# Patient Record
Sex: Female | Born: 1980 | Race: Black or African American | Hispanic: No | Marital: Single | Smoking: Former smoker
Health system: Southern US, Community
[De-identification: ages and names within clinical notes are randomized; demographics above are authoritative.]

## PROBLEM LIST (undated history)

## (undated) ENCOUNTER — Inpatient Hospital Stay (HOSPITAL_COMMUNITY): Payer: Self-pay

## (undated) DIAGNOSIS — F419 Anxiety disorder, unspecified: Secondary | ICD-10-CM

## (undated) DIAGNOSIS — F509 Eating disorder, unspecified: Secondary | ICD-10-CM

## (undated) HISTORY — PX: NO PAST SURGERIES: SHX2092

---

## 2006-10-21 ENCOUNTER — Inpatient Hospital Stay (HOSPITAL_COMMUNITY): Admission: AD | Admit: 2006-10-21 | Discharge: 2006-10-21 | Payer: Self-pay | Admitting: Obstetrics and Gynecology

## 2006-10-29 ENCOUNTER — Inpatient Hospital Stay (HOSPITAL_COMMUNITY): Admission: AD | Admit: 2006-10-29 | Discharge: 2006-10-29 | Payer: Self-pay | Admitting: Obstetrics & Gynecology

## 2010-11-28 LAB — CBC
Hemoglobin: 14.4
Platelets: 180
RDW: 12.9

## 2010-11-28 LAB — URINALYSIS, ROUTINE W REFLEX MICROSCOPIC
Ketones, ur: NEGATIVE
Nitrite: NEGATIVE
pH: 6

## 2010-11-28 LAB — DIFFERENTIAL
Basophils Absolute: 0
Lymphocytes Relative: 55 — ABNORMAL HIGH
Monocytes Absolute: 0.3
Neutro Abs: 2.3
Neutrophils Relative %: 37 — ABNORMAL LOW

## 2010-11-28 LAB — WET PREP, GENITAL
Trich, Wet Prep: NONE SEEN
Yeast Wet Prep HPF POC: NONE SEEN

## 2016-06-11 ENCOUNTER — Encounter (HOSPITAL_COMMUNITY): Payer: Self-pay

## 2016-06-11 ENCOUNTER — Inpatient Hospital Stay (HOSPITAL_COMMUNITY): Payer: Medicaid Other

## 2016-06-11 ENCOUNTER — Inpatient Hospital Stay (HOSPITAL_COMMUNITY)
Admission: AD | Admit: 2016-06-11 | Discharge: 2016-06-11 | Disposition: A | Discharge status: Home / Self Care | Payer: Medicaid Other | Source: Ambulatory Visit | Attending: Obstetrics & Gynecology | Admitting: Obstetrics & Gynecology

## 2016-06-11 DIAGNOSIS — K5900 Constipation, unspecified: Secondary | ICD-10-CM | POA: Diagnosis not present

## 2016-06-11 DIAGNOSIS — O9989 Other specified diseases and conditions complicating pregnancy, childbirth and the puerperium: Secondary | ICD-10-CM | POA: Diagnosis not present

## 2016-06-11 DIAGNOSIS — R109 Unspecified abdominal pain: Secondary | ICD-10-CM | POA: Diagnosis not present

## 2016-06-11 DIAGNOSIS — O219 Vomiting of pregnancy, unspecified: Secondary | ICD-10-CM | POA: Diagnosis not present

## 2016-06-11 DIAGNOSIS — Z3491 Encounter for supervision of normal pregnancy, unspecified, first trimester: Secondary | ICD-10-CM

## 2016-06-11 DIAGNOSIS — Z3A12 12 weeks gestation of pregnancy: Secondary | ICD-10-CM | POA: Diagnosis not present

## 2016-06-11 DIAGNOSIS — O26891 Other specified pregnancy related conditions, first trimester: Secondary | ICD-10-CM | POA: Insufficient documentation

## 2016-06-11 DIAGNOSIS — O99611 Diseases of the digestive system complicating pregnancy, first trimester: Secondary | ICD-10-CM

## 2016-06-11 DIAGNOSIS — O26899 Other specified pregnancy related conditions, unspecified trimester: Secondary | ICD-10-CM

## 2016-06-11 HISTORY — DX: Eating disorder, unspecified: F50.9

## 2016-06-11 HISTORY — DX: Anxiety disorder, unspecified: F41.9

## 2016-06-11 LAB — COMPREHENSIVE METABOLIC PANEL
ALK PHOS: 34 U/L — AB (ref 38–126)
ALT: 14 U/L (ref 14–54)
ANION GAP: 8 (ref 5–15)
AST: 14 U/L — ABNORMAL LOW (ref 15–41)
Albumin: 4 g/dL (ref 3.5–5.0)
BILIRUBIN TOTAL: 0.5 mg/dL (ref 0.3–1.2)
BUN: 6 mg/dL (ref 6–20)
CALCIUM: 9.2 mg/dL (ref 8.9–10.3)
CO2: 23 mmol/L (ref 22–32)
Chloride: 104 mmol/L (ref 101–111)
Creatinine, Ser: 0.6 mg/dL (ref 0.44–1.00)
GFR calc Af Amer: >60 mL/min (ref >60)
Glucose, Bld: 80 mg/dL (ref 65–99)
POTASSIUM: 3.8 mmol/L (ref 3.5–5.1)
Sodium: 135 mmol/L (ref 135–145)
TOTAL PROTEIN: 7 g/dL (ref 6.5–8.1)

## 2016-06-11 LAB — CBC
HEMATOCRIT: 38.4 % (ref 36.0–46.0)
HEMOGLOBIN: 13.3 g/dL (ref 12.0–15.0)
MCH: 30.2 pg (ref 26.0–34.0)
MCHC: 34.6 g/dL (ref 30.0–36.0)
MCV: 87.3 fL (ref 78.0–100.0)
Platelets: 230 10*3/uL (ref 150–400)
RBC: 4.4 MIL/uL (ref 3.87–5.11)
RDW: 13.4 % (ref 11.5–15.5)
WBC: 6.7 10*3/uL (ref 4.0–10.5)

## 2016-06-11 LAB — WET PREP, GENITAL
CLUE CELLS WET PREP: NONE SEEN
Trich, Wet Prep: NONE SEEN
YEAST WET PREP: NONE SEEN

## 2016-06-11 LAB — URINALYSIS, ROUTINE W REFLEX MICROSCOPIC
Bilirubin Urine: NEGATIVE
Glucose, UA: NEGATIVE mg/dL
HGB URINE DIPSTICK: NEGATIVE
Ketones, ur: 5 mg/dL — AB
Leukocytes, UA: NEGATIVE
Nitrite: NEGATIVE
PH: 6 (ref 5.0–8.0)
Protein, ur: NEGATIVE mg/dL
SPECIFIC GRAVITY, URINE: 1.02 (ref 1.005–1.030)

## 2016-06-11 MED ORDER — DOCUSATE SODIUM 100 MG PO CAPS: 100.0000 mg | ORAL_CAPSULE | Freq: Two times a day (BID) | ORAL | 0 refills | Status: DC

## 2016-06-11 MED ORDER — PROMETHAZINE HCL 25 MG PO TABS: 25.0000 mg | ORAL_TABLET | Freq: Four times a day (QID) | ORAL | 0 refills | Status: DC | PRN

## 2016-06-11 MED ORDER — ONDANSETRON 8 MG PO TBDP
8.0000 mg | ORAL_TABLET | Freq: Once | ORAL | Status: AC
  Administered 2016-06-11: 8 mg via ORAL
  Filled 2016-06-11: qty 1

## 2016-06-11 NOTE — MAU Note (Addendum)
Pt c/o nausea/vomiting, abdominal cramping x1 week. States she is unable to keep anything down. Denies vaginal bleeding, Has some vaginal discharge. Had a miscarriage last year and is really worries. Has had 1 PNC visit in [place]-no ultrasound. States EDD 12/21/2016.

## 2016-06-11 NOTE — MAU Note (Signed)
Pt. 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 Feeling dehydrated.  Denies vaginal bleeding, but has off white color discharge, moderate amount.

## 2016-06-11 NOTE — MAU Provider Note (Signed)
History     CSN: [id number]  Arrival date and time: 06/11/16 1845  First Provider Initiated Contact with Patient 06/11/16 2038      Chief Complaint  Patient presents with  . Emesis  . Abdominal Cramping   HPI  Kristina Kennedy is a 36 y.o. [phone] at [redacted]w[redacted]d who presents with abdominal pain & nausea. Abdominal pain started 1 week ago. Reports lower abdominal cramping that is intermittent. Rates pain 8/10. Has not treated. Endorses nausea & vomiting throughout pregnancy. Has not vomited today but vomited 8 times yesterday. Does not have antiemetic at home. Last BM was 4 days ago. Denies vaginal bleeding, fever, diarrhea. Some thin, gray, vaginal discharge. 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   OB History    Gravida Para Term Preterm AB Living   SAB TAB Ectopic Multiple Live Births   2   2          Obstetric Comments   Ectopic x 2, treated with MTX      Past Medical History:  Diagnosis Date  . Anxiety   . Eating disorder     Past Surgical History:  Procedure Laterality Date  . NO PAST SURGERIES      History reviewed. No pertinent family history.  Social History  Substance Use Topics  . Smoking status: Current Some Day Smoker  . Smokeless tobacco: Current User  . Alcohol use 0.6 oz/week    1 Glasses of wine per week    Allergies:  Allergies  Allergen Reactions  . Latex Hives    Prescriptions Prior to Admission  Medication Sig Dispense Refill Last Dose  . albuterol (PROVENTIL HFA;VENTOLIN HFA) 108 (90 Base) MCG/ACT inhaler Inhale 2 puffs into the lungs every 6 (six) hours as needed for wheezing or shortness of breath.   05/28/2016  . Prenatal Vit-Fe Fumarate-FA (PRENATAL MULTIVITAMIN) TABS tablet Take 1 tablet by mouth at bedtime.   06/09/2016    Review of Systems  Constitutional: Negative.   Gastrointestinal: Positive for abdominal pain, constipation, nausea and  vomiting. Negative for diarrhea.  Genitourinary: Positive for vaginal discharge. Negative for dysuria and vaginal bleeding.  Neurological: Positive for dizziness. Negative for headaches.   Physical Exam   Blood pressure 120/72, pulse 89, temperature 97.7 F (36.5 C), resp. rate 17, height  (1.626 m), weight 121 lb (54.9 kg), last menstrual period 03/16/2016, SpO2 100 %.  Physical Exam  Nursing note and vitals reviewed. Constitutional: She is oriented to person, place, and time. She appears well-developed and well-nourished. No distress.  HENT:  Head: Normocephalic and atraumatic.  Eyes: Conjunctivae are normal. Right eye exhibits no discharge. Left eye exhibits no discharge. No scleral icterus.  Neck: Normal range of motion.  Cardiovascular: Normal rate, regular rhythm and normal heart sounds.   No murmur heard. Respiratory: Effort normal and breath sounds normal. No respiratory distress. She has no wheezes.  GI: Soft. Bowel sounds are normal. There is tenderness in the left lower quadrant. There is no rebound and no guarding.  Genitourinary: Uterus is enlarged. Cervix exhibits no motion tenderness and no friability. Right adnexum displays no mass and no tenderness. Left adnexum displays mass and tenderness. No bleeding in the vagina. Vaginal discharge found.  Genitourinary Comments: Cervix closed  Neurological: She is alert and oriented to person, place, and time.  Skin: Skin is warm and dry.  She is not diaphoretic.  Psychiatric: She has a normal mood and affect. Her behavior is normal. Judgment and thought content normal.    MAU Course  Procedures Results for orders placed or performed during the hospital encounter of 06/11/16 (from the past 24 hour(s))  Urinalysis, Routine w reflex microscopic     Status: Abnormal   Collection Time: 06/11/16  7:27 PM  Result Value Ref Range   Color, Urine YELLOW YELLOW   APPearance CLEAR CLEAR   Specific Gravity, Urine 1.020 1.005 - 1.030    pH 6.0 5.0 - 8.0   Glucose, UA NEGATIVE NEGATIVE mg/dL   Hgb urine dipstick NEGATIVE NEGATIVE   Bilirubin Urine NEGATIVE NEGATIVE   Ketones, ur 5 (A) NEGATIVE mg/dL   Protein, ur NEGATIVE NEGATIVE mg/dL   Nitrite NEGATIVE NEGATIVE   Leukocytes, UA NEGATIVE NEGATIVE  Wet prep, genital     Status: Abnormal   Collection Time: 06/11/16  9:04 PM  Result Value Ref Range   Yeast Wet Prep HPF POC NONE SEEN NONE SEEN   Trich, Wet Prep NONE SEEN NONE SEEN   Clue Cells Wet Prep HPF POC NONE SEEN NONE SEEN   WBC, Wet Prep HPF POC FEW (A) NONE SEEN   Sperm PRESENT   CBC     Status: None   Collection Time: 06/11/16  9:05 PM  Result Value Ref Range   WBC 6.7 4.0 - 10.5 K/uL   RBC 4.40 3.87 - 5.11 MIL/uL   Hemoglobin 13.3 12.0 - 15.0 g/dL   HCT 16.1 09.6 - 04.5 %   MCV 87.3 78.0 - 100.0 fL   MCH 30.2 26.0 - 34.0 pg   MCHC 34.6 30.0 - 36.0 g/dL   RDW 40.9 81.1 - 91.4 %   Platelets 230 150 - 400 K/uL  Comprehensive metabolic panel     Status: Abnormal   Collection Time: 06/11/16  9:05 PM  Result Value Ref Range   Sodium 135 135 - 145 mmol/L   Potassium 3.8 3.5 - 5.1 mmol/L   Chloride 104 101 - 111 mmol/L   CO2 23 22 - 32 mmol/L   Glucose, Bld 80 65 - 99 mg/dL   BUN 6 6 - 20 mg/dL   Creatinine, Ser 7.82 0.44 - 1.00 mg/dL   Calcium 9.2 8.9 - 95.6 mg/dL   Total Protein 7.0 6.5 - 8.1 g/dL   Albumin 4.0 3.5 - 5.0 g/dL   AST 14 (L) 15 - 41 U/L   ALT 14 14 - 54 U/L   Alkaline Phosphatase 34 (L) 38 - 126 U/L   Total Bilirubin 0.5 0.3 - 1.2 mg/dL   GFR calc non Af Amer >60 >60 mL/min   GFR calc Af Amer >60 >60 mL/min   Anion gap 8 5 - 15  ABO/Rh     Status: None (Preliminary result)   Collection Time: 06/11/16  9:10 PM  Result Value Ref Range   ABO/RH(D) A NEG    US Ob Comp Less 14 Wks  Result Date: 06/11/2016 CLINICAL DATA:  Cramping x1 week with vomiting EXAM: OBSTETRIC <14 WK ULTRASOUND TECHNIQUE: Transabdominal ultrasound was performed for evaluation of the gestation as well as the  maternal uterus and adnexal regions. COMPARISON:  None. FINDINGS: Intrauterine gestational sac: Single Yolk sac:  Not Visualized. Embryo:  Visualized. Cardiac Activity: Visualized. Heart Rate: 168 bpm CRL:   63.7  mm   12 w 5 d  [place] EDC: 12/19/2016 Subchorionic hemorrhage:  None visualized. Maternal uterus/adnexae: Normal. Subjectively normal amniotic fluid. IMPRESSION: Single live intrauterine gestation at 12 weeks 5 days. No findings for patient's cramping. Electronically Signed   By: Tollie Eth M.D.   On: 06/11/2016 21:58    MDM FHT 165 VSS, NAD Cervix closed CBC -- hemoblobin 13.3 Ultrasound to check ovaries; no ovarian cyst Zofran 8 mg ODT Pain resolved with pain medication Assessment and Plan  A; 1. Fetal heart tones present, first trimester   2. Abdominal pain affecting pregnancy   3. Nausea and vomiting during pregnancy prior to [redacted] weeks gestation   4. Constipation during pregnancy in first trimester    P: Discharge home Rx phenergan & colace Discussed reasons to return to MAU Keep follow up appointment with OB/PCP  GC/CT pending   Judeth Horn 06/11/2016, 8:29 PM

## 2016-06-11 NOTE — Discharge Instructions (Signed)
Abdominal Pain During Pregnancy Abdominal pain is common in pregnancy. Most of the time, it does not cause harm. There are many causes of abdominal pain. Some causes are more serious than others and sometimes the cause is not known. Abdominal pain can be a sign that something is very wrong with the pregnancy or the pain may have nothing to do with the pregnancy. Always tell your health care provider if you have any abdominal pain. Follow these instructions at home:  Do not have sex or put anything in your vagina until your symptoms go away completely.  Watch your abdominal pain for any changes.  Get plenty of rest until your pain improves.  Drink enough fluid to keep your urine clear or pale yellow.  Take over-the-counter or prescription medicines only as told by your health care provider.  Keep all follow-up visits as told by your health care provider. This is important. Contact a health care provider if:  You have a fever.  Your pain gets worse or you have cramping.  Your pain continues after resting. Get help right away if:  You are bleeding, leaking fluid, or passing tissue from the vagina.  You have vomiting or diarrhea that does not go away.  You have painful or bloody urination.  You notice a decrease in your baby's movements.  You feel very weak or faint.  You have shortness of breath.  You develop a severe headache with abdominal pain.  You have abnormal vaginal discharge with abdominal pain. This information is not intended to replace advice given to you by your health care provider. Make sure you discuss any questions you have with your health care provider. Document Released: 02/02/2005 Document Revised: 11/14/2015 Document Reviewed: 09/01/2012 Elsevier Interactive Patient Education  2017 [employer]. Constipation, Adult Constipation is when a person has fewer bowel movements in a week than normal, has difficulty having a bowel movement, or has stools that  are dry, hard, or larger than normal. Constipation may be caused by an underlying condition. It may become worse with age if a person takes certain medicines and does not take in enough fluids. Follow these instructions at home: Eating and drinking    Eat foods that have a lot of fiber, such as fresh fruits and vegetables, whole grains, and beans.  Limit foods that are high in fat, low in fiber, or overly processed, such as french fries, hamburgers, cookies, candies, and soda.  Drink enough fluid to keep your urine clear or pale yellow. General instructions   Exercise regularly or as told by your health care provider.  Go to the restroom when you have the urge to go. Do not hold it in.  Take over-the-counter and prescription medicines only as told by your health care provider. These include any fiber supplements.  Practice pelvic floor retraining exercises, such as deep breathing while relaxing the lower abdomen and pelvic floor relaxation during bowel movements.  Watch your condition for any changes.  Keep all follow-up visits as told by your health care provider. This is important. Contact a health care provider if:  You have pain that gets worse.  You have a fever.  You do not have a bowel movement after 4 days.  You vomit.  You are not hungry.  You lose weight.  You are bleeding from the anus.  You have thin, pencil-like stools. Get help right away if:  You have a fever and your symptoms suddenly get worse.  You leak stool or have  blood in your stool.  Your abdomen is bloated.  You have severe pain in your abdomen.  You feel dizzy or you faint. This information is not intended to replace advice given to you by your health care provider. Make sure you discuss any questions you have with your health care provider. Document Released: 11/01/2003 Document Revised: 08/23/2015 Document Reviewed: 07/24/2015 Elsevier Interactive Patient Education  2017 [employer].

## 2016-06-12 LAB — GC/CHLAMYDIA PROBE AMP (~~LOC~~) NOT AT ARMC
Chlamydia: NEGATIVE
NEISSERIA GONORRHEA: NEGATIVE

## 2016-06-12 LAB — ABO/RH: ABO/RH(D): A NEG

## 2016-06-12 LAB — HIV ANTIBODY (ROUTINE TESTING W REFLEX): HIV SCREEN 4TH GENERATION: NONREACTIVE

## 2017-03-15 ENCOUNTER — Encounter (HOSPITAL_COMMUNITY): Payer: Self-pay

## 2018-09-19 IMAGING — US US OB COMP LESS 14 WK
1 series · 15 of 28 positions shown · non-contrast
Comparison: None.

CLINICAL DATA: Cramping x1 week with vomiting

EXAM:
OBSTETRIC <14 WK ULTRASOUND
TECHNIQUE: Transabdominal ultrasound was performed for evaluation of the
gestation as well as the maternal uterus and adnexal regions.

[Series 1: us ob comp less 14 wk · 15 of 29 slices shown]
[im 1/29]
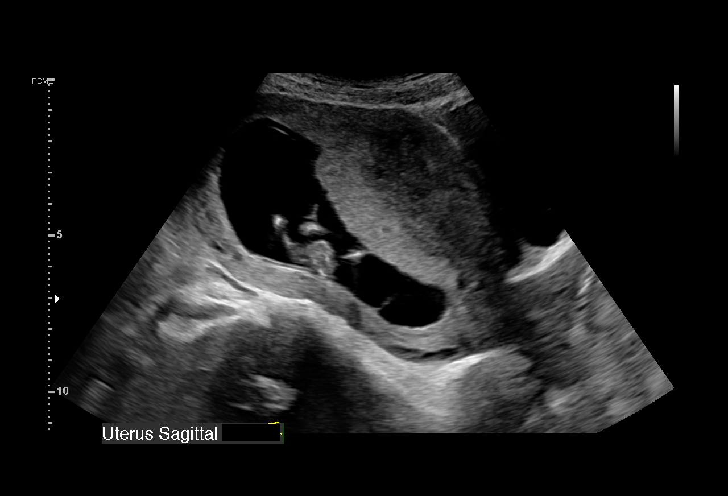
[im 3/29]
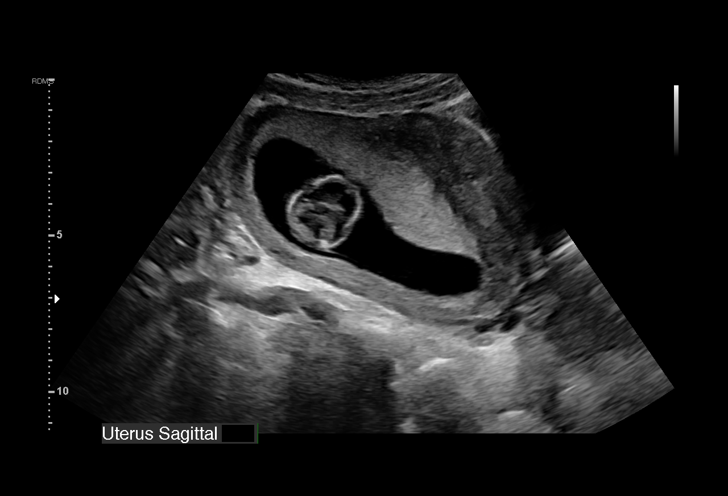
[im 5/29]
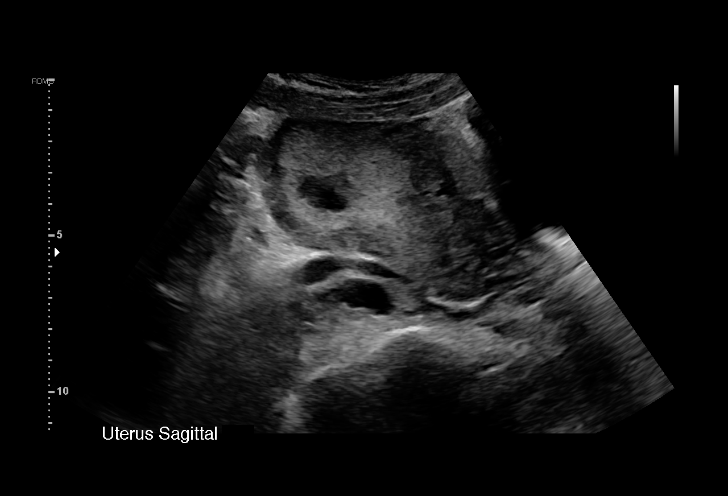
[im 7/29]
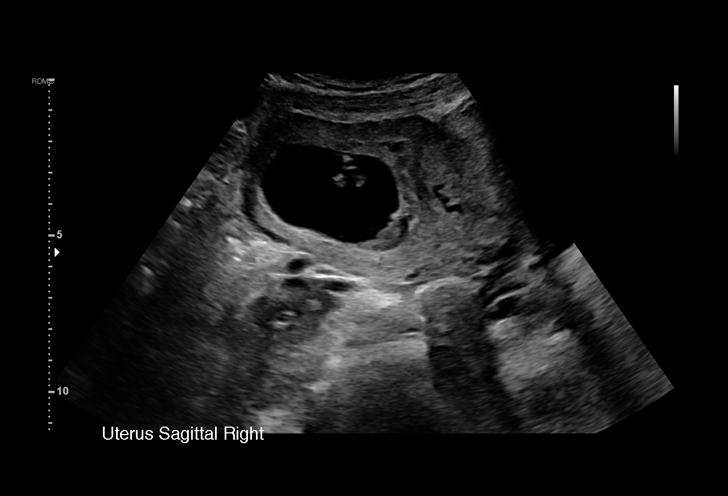
[im 9/29]
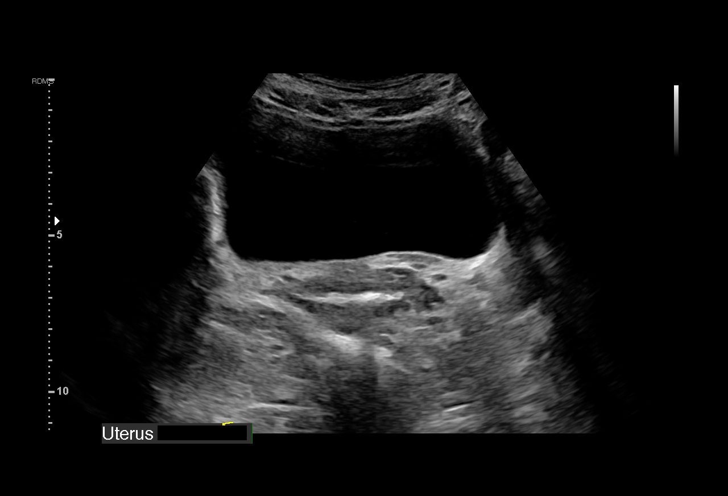
[im 11/29]
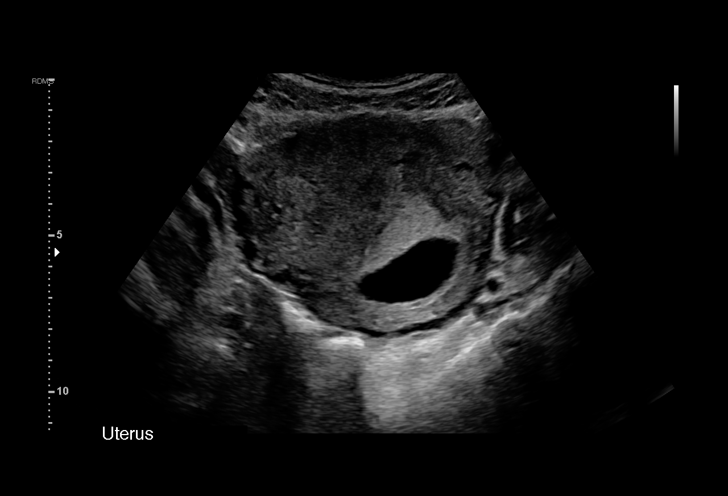
[im 13/29]
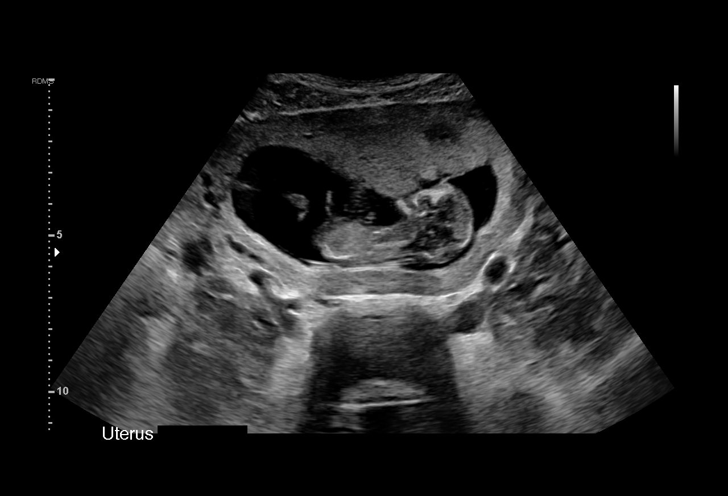
[im 15/29]
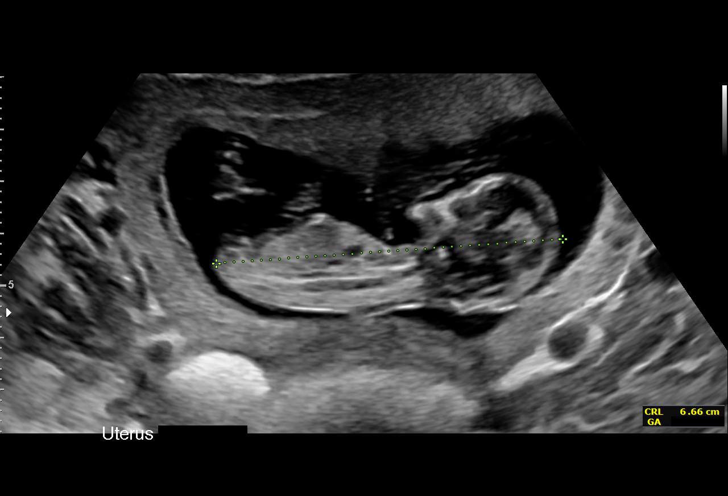
[im 16/29]
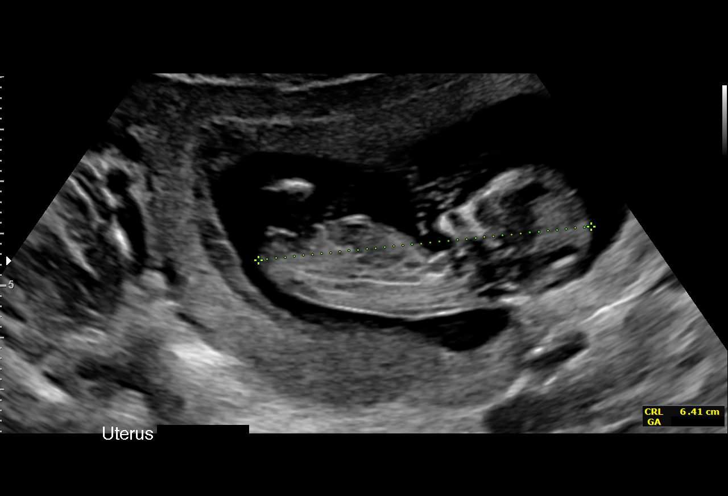
[im 18/29]
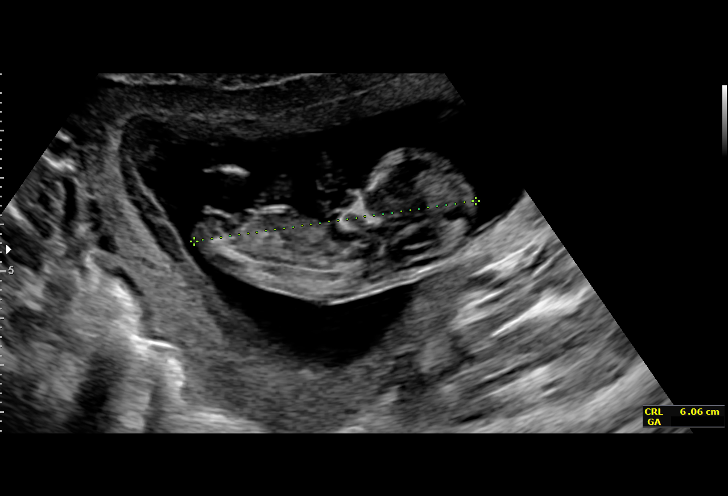
[im 20/29]
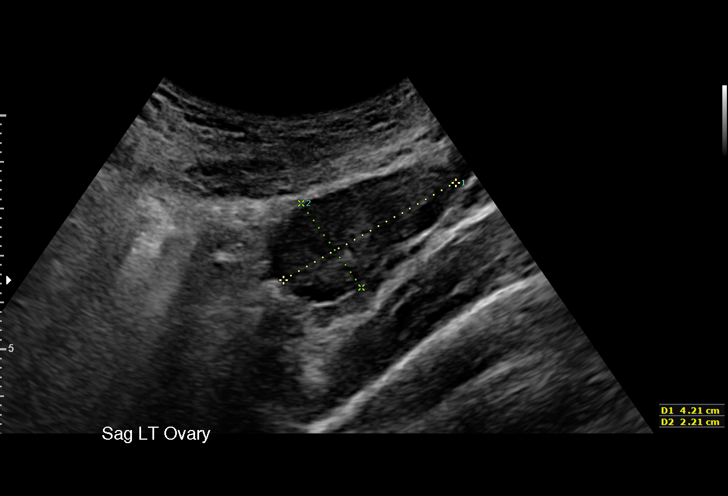
[im 22/29]
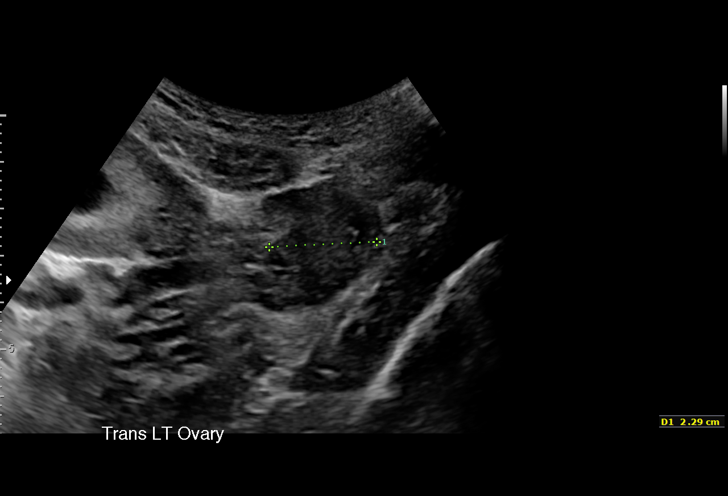
[im 24/29]
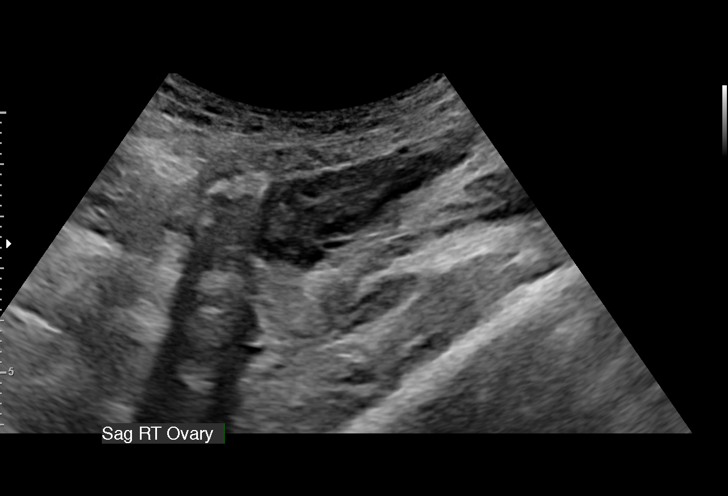
[im 26/29]
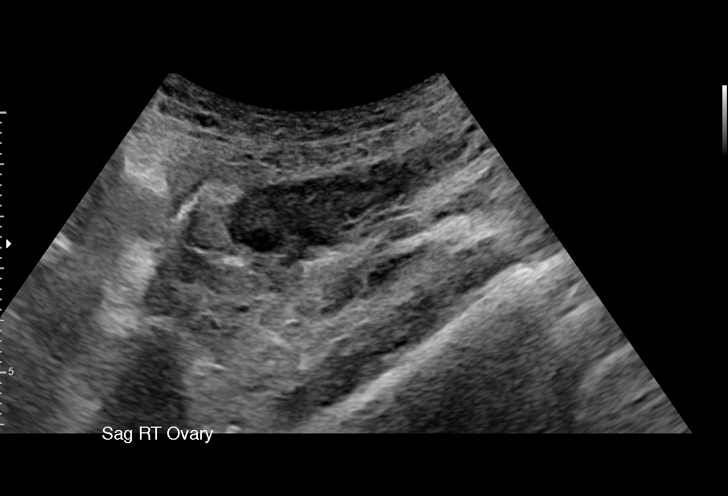
[im 29/29]
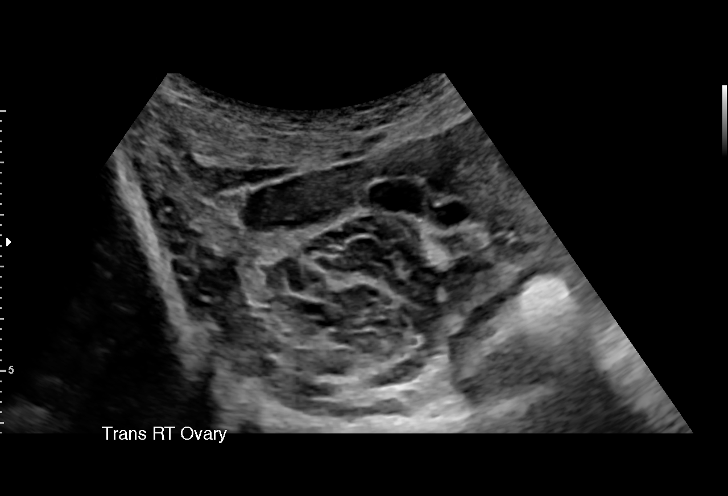

[15 of 28 positions shown; findings below may reference images not displayed]

FINDINGS: Intrauterine gestational sac: Single

Yolk sac:  Not Visualized.

Embryo:  Visualized.

Cardiac Activity: Visualized.

Heart Rate: 168 bpm

CRL:   63.7  mm   12 w 5 d                  US EDC: 12/19/2016

Subchorionic hemorrhage:  None visualized.

Maternal uterus/adnexae: Normal. Subjectively normal amniotic fluid.
IMPRESSION: Single live intrauterine gestation at 12 weeks 5 days. No findings
for patient's cramping.

## 2019-09-17 DIAGNOSIS — S6291XA Unspecified fracture of right wrist and hand, initial encounter for closed fracture: Secondary | ICD-10-CM

## 2019-09-17 HISTORY — DX: Unspecified fracture of right wrist and hand, initial encounter for closed fracture: S62.91XA

## 2020-01-24 ENCOUNTER — Other Ambulatory Visit: Payer: Self-pay

## 2020-01-24 ENCOUNTER — Emergency Department (INDEPENDENT_AMBULATORY_CARE_PROVIDER_SITE_OTHER)
Admission: RE | Admit: 2020-01-24 | Discharge: 2020-01-24 | Disposition: A | Discharge status: Home / Self Care | Payer: Medicaid Other | Source: Ambulatory Visit | Attending: Family Medicine | Admitting: Family Medicine

## 2020-01-24 ENCOUNTER — Emergency Department (INDEPENDENT_AMBULATORY_CARE_PROVIDER_SITE_OTHER): Payer: Medicaid Other

## 2020-01-24 VITALS — BP 111/77 | HR 68 | Temp 98.5°F | Resp 18 | Ht 64.0 in | Wt 125.0 lb

## 2020-01-24 DIAGNOSIS — M79641 Pain in right hand: Secondary | ICD-10-CM | POA: Diagnosis not present

## 2020-01-24 DIAGNOSIS — S62306A Unspecified fracture of fifth metacarpal bone, right hand, initial encounter for closed fracture: Secondary | ICD-10-CM | POA: Diagnosis not present

## 2020-01-24 MED ORDER — IBUPROFEN 800 MG PO TABS: 800.0000 mg | ORAL_TABLET | Freq: Three times a day (TID) | ORAL | 0 refills | Status: AC

## 2020-01-24 NOTE — ED Notes (Signed)
R wrist splint applied per order of Dr. Tracie Harrier.

## 2020-01-24 NOTE — ED Triage Notes (Signed)
Pt presents to Urgent Care with c/o R hand pain. Reports she had a fracture to R hand in August and wore a cast for 2 weeks, then a "soft cast" for 2 weeks. Pt states she feels that it has never healed d/t continued, worsening pain. Reports that R hand began tingling and swelling yesterday.

## 2020-01-25 NOTE — ED Provider Notes (Signed)
[facility]   [id number] 01/24/20 Arrival Time: 1825  ASSESSMENT & PLAN:  1. Right hand pain     I have personally viewed the imaging studies ordered this visit. 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 See radiology report.  Begin; Meds ordered this encounter  Medications  . ibuprofen (ADVIL) 800 MG tablet    Sig: Take 1 tablet (800 mg total) by mouth 3 (three) times daily with meals.    Dispense:  21 tablet    Refill:  0    Orders Placed This Encounter  Procedures  . DG Hand Complete Right  . Apply wrist brace    Recommend:  Follow-up Information    Schedule an appointment as soon as possible for a visit  with Your orthopaedic surgeon..               Work notes provided.  Reviewed expectations re: course of current medical issues. Questions answered. Outlined signs and symptoms indicating need for more acute intervention. Patient verbalized understanding. After Visit Summary given.  SUBJECTIVE: History from: patient. Kristina Kennedy is a 39 y.o. female who reports a R hand fx approx 4 months ago; seen by ortho; "casted for a long time". Now having continued discomfort of R hand, esp with certain movements and gripping. No extremity sensation changes or weakness. "Just really sore". No new trauma or injury. No OTC analgesics.  Past Surgical History:  Procedure Laterality Date  . NO PAST SURGERIES        OBJECTIVE:  Vitals:   01/24/20 1900 01/24/20 1910  BP:  111/77  Pulse:  68  Resp:  18  Temp:  98.5 F (36.9 C)  TempSrc:  Oral  SpO2:  98%  Weight: 56.7 kg   Height: 5\' 4"  (1.626 m)     General appearance: alert; no distress HEENT: [place]; AT Neck: supple with FROM Resp: unlabored respirations Extremities: . R hand: warm with well perfused appearance; fairly well localized moderate tenderness over right 5th metacarpal; without gross deformities; swelling: none; bruising: none; all fingers ROM: normal CV: brisk extremity capillary refill of  RUE; 2+ radial pulse of RUE. Skin: warm and dry; no visible rashes Neurologic: gait normal; normal sensation and strength of RUE Psychological: alert and cooperative; normal mood and affect  Imaging: DG Hand Complete Right  Result Date: 01/24/2020 CLINICAL DATA:  Status post fall 2 weeks ago. EXAM: RIGHT HAND - COMPLETE 3+ VIEW COMPARISON:  None. FINDINGS: There is no evidence of an acute fracture or dislocation. A chronic fracture deformity of the fifth right metacarpal is seen. There is no evidence of arthropathy or other focal bone abnormality. Soft tissues are unremarkable. 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 Electronically Signed   By: 14/09/2019 M.D.   On: 01/24/2020 19:47      Allergies  Allergen Reactions  . Latex Hives  . Remeron [Mirtazapine] Hives    Past Medical History:  Diagnosis Date  . Anxiety   . Eating disorder   . Hand fracture, right 09/2019   Social History   Socioeconomic History  . Marital status: Single    Spouse name: Not on file  . Number of children: Not on file  . Years of education: Not on file  . Highest education level: Not on file  Occupational History  . Not on file  Tobacco Use  . Smoking status: Former 10/2019  . Smokeless tobacco: Never Used  . Tobacco comment: Quit in 2018  Substance and Sexual Activity  .  Alcohol use: Not Currently    Alcohol/week: 1.0 standard drink    Types: 1 Glasses of wine per week  . Drug use: No  . Sexual activity: Yes  Other Topics Concern  . Not on file  Social History Narrative  . Not on file   Social Determinants of Health   Financial Resource Strain: Not on file  Food Insecurity: Not on file  Transportation Needs: Not on file  Physical Activity: Not on file  Stress: Not on file  Social Connections: Not on file   Family History  Problem Relation Age of Onset  . Healthy Mother   . Diabetes Father   . Hypertension Father    Past Surgical History:  Procedure  Laterality Date  . NO PAST SURGERIES        Mardella Layman, MD 01/25/20 1001

## 2021-10-18 ENCOUNTER — Ambulatory Visit
Admission: EM | Admit: 2021-10-18 | Discharge: 2021-10-18 | Disposition: A | Discharge status: Home / Self Care | Payer: Medicaid Other | Attending: Family Medicine | Admitting: Family Medicine

## 2021-10-18 ENCOUNTER — Telehealth: Payer: Self-pay | Admitting: Emergency Medicine

## 2021-10-18 ENCOUNTER — Ambulatory Visit: Admit: 2021-10-18 | Payer: Medicaid Other

## 2021-10-18 ENCOUNTER — Encounter: Payer: Self-pay | Admitting: Emergency Medicine

## 2021-10-18 DIAGNOSIS — R5383 Other fatigue: Secondary | ICD-10-CM | POA: Insufficient documentation

## 2021-10-18 DIAGNOSIS — N760 Acute vaginitis: Secondary | ICD-10-CM | POA: Insufficient documentation

## 2021-10-18 DIAGNOSIS — R0609 Other forms of dyspnea: Secondary | ICD-10-CM | POA: Insufficient documentation

## 2021-10-18 DIAGNOSIS — R5381 Other malaise: Secondary | ICD-10-CM | POA: Insufficient documentation

## 2021-10-18 DIAGNOSIS — N939 Abnormal uterine and vaginal bleeding, unspecified: Secondary | ICD-10-CM | POA: Insufficient documentation

## 2021-10-18 DIAGNOSIS — B9689 Other specified bacterial agents as the cause of diseases classified elsewhere: Secondary | ICD-10-CM | POA: Insufficient documentation

## 2021-10-18 MED ORDER — METRONIDAZOLE 500 MG PO TABS: 500.0000 mg | ORAL_TABLET | Freq: Two times a day (BID) | ORAL | 0 refills | Status: AC

## 2021-10-18 MED ORDER — ONDANSETRON HCL 8 MG PO TABS: 8.0000 mg | ORAL_TABLET | Freq: Three times a day (TID) | ORAL | 0 refills | Status: AC | PRN

## 2021-10-18 NOTE — ED Triage Notes (Signed)
Patient states that she is having vaginal bleeding x 9 days.  Patient is also having white vaginal discharge w/an odor.  Denies any itching.  Patient also having a headache, body aches, fatigue feeling.  Patient has had a tubal.  Home COVID test was negative.  Patient has taken Tylenol.

## 2021-10-18 NOTE — Telephone Encounter (Signed)
Call by this RN after review of symptoms w/ provider to  assess if patient needed to go to ED. No answer - no voice mail set up

## 2021-10-18 NOTE — ED Provider Notes (Signed)
[facility]    CSN: [id number] Arrival date & time: 10/18/21  1526      History   Chief Complaint Chief Complaint  Patient presents with   Vaginal Bleeding    HPI Kristina Kennedy is a 41 y.o. female.   HPI  Patient states that she has not felt well for approximately 10 days.  She has fatigue.  Malaise.  Abnormal vaginal bleeding.  Vaginal odor and discomfort.  Short of breath with exertion.  She is done COVID test at home that were negative.  She states she is lost 12 pounds in the last 12 days.  She does have a history of an eating disorder.  She states that she "cannot eat", any food she has presented with makes her nauseated and her throat feels like it is going to close up.  No vomiting.  No diarrhea.  Past Medical History:  Diagnosis Date   Anxiety    Eating disorder    Hand fracture, right 09/2019    Patient Active Problem List   Diagnosis Date Noted   Hand fracture, right 09/2019    Past Surgical History:  Procedure Laterality Date   NO PAST SURGERIES      OB History     Gravida  9   Para  4   Term  4   Preterm      AB  4   Living  4      SAB  2   IAB      Ectopic  2   Multiple      Live Births           Obstetric Comments  Ectopic x 2, treated with MTX          Home Medications    Prior to Admission medications   Medication Sig Start Date End Date Taking? Authorizing Provider  metroNIDAZOLE (FLAGYL) 500 MG tablet Take 1 tablet (500 mg total) by mouth 2 (two) times daily. 10/18/21  Yes Eustace Moore, MD  ondansetron (ZOFRAN) 8 MG tablet Take 1 tablet (8 mg total) by mouth every 8 (eight) hours as needed for nausea or vomiting. 10/18/21  Yes Eustace Moore, MD  albuterol (PROVENTIL HFA;VENTOLIN HFA) 108 (90 Base) MCG/ACT inhaler Inhale 2 puffs into the lungs every 6 (six) hours as needed for wheezing or shortness of breath.    [provider]  ibuprofen (ADVIL) 800 MG tablet Take 1 tablet (800 mg  total) by mouth 3 (three) times daily with meals. 01/24/20   Mardella Layman, MD  promethazine (PHENERGAN) 25 MG tablet Take 1 tablet (25 mg total) by mouth every 6 (six) hours as needed for nausea or vomiting. 06/11/16 01/24/20  Judeth Horn, NP    Family History Family History  Problem Relation Age of Onset   Healthy Mother    Diabetes Father    Hypertension Father     Social History Social History   Tobacco Use   Smoking status: Former   Smokeless tobacco: Never   Tobacco comments:    Quit in 2018  Substance Use Topics   Alcohol use: Not Currently    Alcohol/week: 1.0 standard drink of alcohol    Types: 1 Glasses of wine per week   Drug use: No     Allergies   Latex and Remeron [mirtazapine]   Review of Systems Review of Systems See HPI  Physical Exam Triage Vital Signs ED Triage Vitals  Enc Vitals Group  BP 10/18/21 1553 116/76     Pulse Rate 10/18/21 1553 72     Resp 10/18/21 1553 18     Temp 10/18/21 1553 98.7 F (37.1 C)     Temp Source 10/18/21 1553 Oral     SpO2 10/18/21 1553 100 %     Weight 10/18/21 1554 114 lb (51.7 kg)     Height 10/18/21 1554 5\' 4"  (1.626 m)     Head Circumference --      Peak Flow --      Pain Score 10/18/21 1554 0     Pain Loc --      Pain Edu? --      Excl. in GC? --    No data found.  Updated Vital Signs BP 116/76 (BP Location: Right Arm)   Pulse 72   Temp 98.7 F (37.1 C) (Oral)   Resp 18   Ht 5\' 4"  (1.626 m)   Wt 51.7 kg   LMP 10/04/2021   SpO2 100%   Breastfeeding No   BMI 19.57 kg/m       Physical Exam Constitutional:      General: She is not in acute distress.    Appearance: She is well-developed and normal weight. She is ill-appearing.  HENT:     Head: Normocephalic and atraumatic.     Right Ear: Tympanic membrane, ear canal and external ear normal.     Left Ear: Tympanic membrane, ear canal and external ear normal.     Nose: Nose normal. No congestion.     Mouth/Throat:     Mouth: Mucous  membranes are moist.     Pharynx: No posterior oropharyngeal erythema.  Eyes:     Conjunctiva/sclera: Conjunctivae normal.     Pupils: Pupils are equal, round, and reactive to light.  Cardiovascular:     Rate and Rhythm: Normal rate and regular rhythm.     Heart sounds: Normal heart sounds.  Pulmonary:     Effort: Pulmonary effort is normal. No respiratory distress.     Breath sounds: Normal breath sounds.  Abdominal:     General: There is no distension.     Palpations: Abdomen is soft.     Tenderness: There is no abdominal tenderness.     Comments: Mild tenderness to deep palpation in the suprapubic region  Musculoskeletal:        General: Normal range of motion.     Cervical back: Normal range of motion and neck supple.  Lymphadenopathy:     Cervical: No cervical adenopathy.  Skin:    General: Skin is warm and dry.  Neurological:     General: No focal deficit present.     Mental Status: She is alert.  Psychiatric:        Mood and Affect: Mood normal.        Behavior: Behavior normal.      UC Treatments / Results  Labs (all labs ordered are listed, but only abnormal results are displayed) Labs Reviewed  CBC WITH DIFFERENTIAL/PLATELET  COMPLETE METABOLIC PANEL WITH GFR  CERVICOVAGINAL ANCILLARY ONLY    EKG   Radiology No results found.  Procedures Procedures (including critical care time)  Medications Ordered in UC Medications - No data to display  Initial Impression / Assessment and Plan / UC Course  I have reviewed the triage vital signs and the nursing notes.  Pertinent labs & imaging results that were available during my care of the patient were reviewed by me and considered in  my medical decision making (see chart for details).     Numerous symptoms.  No physical exam findings.  Patient does have a vaginal odor and a history of BV.  We will treat this, and send out testing.  PCP follow-up is recommended Final Clinical Impressions(s) / UC Diagnoses    Final diagnoses:  Abnormal vaginal bleeding  BV (bacterial vaginosis)  Malaise and fatigue  DOE (dyspnea on exertion)     Discharge Instructions      Take antibiotic 2 times a day Take with food Check chart for your test results tomorrow Drink plenty of water Take Zofran if needed for nausea Call on Tuesday to make an appointment with your primary care doctor    ED Prescriptions     Medication Sig Dispense Auth. Provider   metroNIDAZOLE (FLAGYL) 500 MG tablet Take 1 tablet (500 mg total) by mouth 2 (two) times daily. 14 tablet Eustace Moore, MD   ondansetron (ZOFRAN) 8 MG tablet Take 1 tablet (8 mg total) by mouth every 8 (eight) hours as needed for nausea or vomiting. 20 tablet Eustace Moore, MD      PDMP not reviewed this encounter.   Eustace Moore, MD 10/18/21 [phone]

## 2021-10-18 NOTE — Discharge Instructions (Signed)
Take antibiotic 2 times a day Take with food Check chart for your test results tomorrow Drink plenty of water Take Zofran if needed for nausea Call on Tuesday to make an appointment with your primary care doctor

## 2021-10-19 LAB — COMPLETE METABOLIC PANEL WITH GFR
AG Ratio: 2 (calc) (ref 1.0–2.5)
ALT: 10 U/L (ref 6–29)
AST: 14 U/L (ref 10–30)
Albumin: 4.7 g/dL (ref 3.6–5.1)
Alkaline phosphatase (APISO): 36 U/L (ref 31–125)
BUN/Creatinine Ratio: 13 (calc) (ref 6–22)
BUN: 15 mg/dL (ref 7–25)
CO2: 23 mmol/L (ref 20–32)
Calcium: 9.3 mg/dL (ref 8.6–10.2)
Chloride: 107 mmol/L (ref 98–110)
Creat: 1.12 mg/dL — ABNORMAL HIGH (ref 0.50–0.97)
Globulin: 2.4 g/dL (calc) (ref 1.9–3.7)
Glucose, Bld: 82 mg/dL (ref 65–99)
Potassium: 4.6 mmol/L (ref 3.5–5.3)
Sodium: 138 mmol/L (ref 135–146)
Total Bilirubin: 0.4 mg/dL (ref 0.2–1.2)
Total Protein: 7.1 g/dL (ref 6.1–8.1)
eGFR: 64 mL/min/{1.73_m2} (ref >=60)

## 2021-10-19 LAB — CBC WITH DIFFERENTIAL/PLATELET
Absolute Monocytes: 402 cells/uL (ref 200–950)
Basophils Absolute: 30 cells/uL (ref 0–200)
Basophils Relative: 0.5 %
Eosinophils Absolute: 90 cells/uL (ref 15–500)
Eosinophils Relative: 1.5 %
HCT: 41 % (ref 35.0–45.0)
Hemoglobin: 13.8 g/dL (ref 11.7–15.5)
Lymphs Abs: 2076 cells/uL (ref 850–3900)
MCH: 30.1 pg (ref 27.0–33.0)
MCHC: 33.7 g/dL (ref 32.0–36.0)
MCV: 89.5 fL (ref 80.0–100.0)
MPV: 12.5 fL (ref 7.5–12.5)
Monocytes Relative: 6.7 %
Neutro Abs: 3402 cells/uL (ref 1500–7800)
Neutrophils Relative %: 56.7 %
Platelets: 177 10*3/uL (ref 140–400)
RBC: 4.58 10*6/uL (ref 3.80–5.10)
RDW: 12.3 % (ref 11.0–15.0)
Total Lymphocyte: 34.6 %
WBC: 6 10*3/uL (ref 3.8–10.8)

## 2021-10-21 LAB — CERVICOVAGINAL ANCILLARY ONLY
Bacterial Vaginitis (gardnerella): POSITIVE — AB
Candida Glabrata: NEGATIVE
Candida Vaginitis: NEGATIVE
Chlamydia: NEGATIVE
Comment: NEGATIVE
Comment: NEGATIVE
Comment: NEGATIVE
Comment: NEGATIVE
Comment: NEGATIVE
Comment: NORMAL
Neisseria Gonorrhea: NEGATIVE
Trichomonas: NEGATIVE
